# Patient Record
Sex: Female | Born: 1965 | Race: Black or African American | Hispanic: No | Marital: Married | Smoking: Never smoker
Health system: Southern US, Community
[De-identification: ages and names within clinical notes are randomized; demographics above are authoritative.]

## PROBLEM LIST (undated history)

## (undated) DIAGNOSIS — I509 Heart failure, unspecified: Secondary | ICD-10-CM

## (undated) DIAGNOSIS — I517 Cardiomegaly: Secondary | ICD-10-CM

## (undated) DIAGNOSIS — E119 Type 2 diabetes mellitus without complications: Secondary | ICD-10-CM

## (undated) HISTORY — PX: CARDIAC DEFIBRILLATOR PLACEMENT: SHX171

---

## 2016-02-02 ENCOUNTER — Encounter (HOSPITAL_BASED_OUTPATIENT_CLINIC_OR_DEPARTMENT_OTHER): Payer: Self-pay

## 2016-02-02 ENCOUNTER — Emergency Department (HOSPITAL_BASED_OUTPATIENT_CLINIC_OR_DEPARTMENT_OTHER)
Admission: EM | Admit: 2016-02-02 | Discharge: 2016-02-02 | Disposition: A | Discharge status: Home / Self Care | Payer: Federal, State, Local not specified - PPO | Attending: Emergency Medicine | Admitting: Emergency Medicine

## 2016-02-02 ENCOUNTER — Emergency Department (HOSPITAL_BASED_OUTPATIENT_CLINIC_OR_DEPARTMENT_OTHER): Payer: Federal, State, Local not specified - PPO

## 2016-02-02 DIAGNOSIS — I509 Heart failure, unspecified: Secondary | ICD-10-CM | POA: Insufficient documentation

## 2016-02-02 DIAGNOSIS — W010XXA Fall on same level from slipping, tripping and stumbling without subsequent striking against object, initial encounter: Secondary | ICD-10-CM | POA: Diagnosis not present

## 2016-02-02 DIAGNOSIS — M25571 Pain in right ankle and joints of right foot: Secondary | ICD-10-CM | POA: Diagnosis present

## 2016-02-02 DIAGNOSIS — S80212A Abrasion, left knee, initial encounter: Secondary | ICD-10-CM | POA: Diagnosis not present

## 2016-02-02 DIAGNOSIS — Z79899 Other long term (current) drug therapy: Secondary | ICD-10-CM | POA: Diagnosis not present

## 2016-02-02 DIAGNOSIS — Y999 Unspecified external cause status: Secondary | ICD-10-CM | POA: Insufficient documentation

## 2016-02-02 DIAGNOSIS — Z794 Long term (current) use of insulin: Secondary | ICD-10-CM | POA: Diagnosis not present

## 2016-02-02 DIAGNOSIS — E119 Type 2 diabetes mellitus without complications: Secondary | ICD-10-CM | POA: Insufficient documentation

## 2016-02-02 DIAGNOSIS — Y9302 Activity, running: Secondary | ICD-10-CM | POA: Insufficient documentation

## 2016-02-02 DIAGNOSIS — Y92002 Bathroom of unspecified non-institutional (private) residence single-family (private) house as the place of occurrence of the external cause: Secondary | ICD-10-CM | POA: Insufficient documentation

## 2016-02-02 DIAGNOSIS — S93401A Sprain of unspecified ligament of right ankle, initial encounter: Secondary | ICD-10-CM | POA: Insufficient documentation

## 2016-02-02 HISTORY — DX: Heart failure, unspecified: I50.9

## 2016-02-02 HISTORY — DX: Type 2 diabetes mellitus without complications: E11.9

## 2016-02-02 HISTORY — DX: Cardiomegaly: I51.7

## 2016-02-02 NOTE — ED Notes (Signed)
Slipped on mat at home this am-twister right foot and fell on knees-ambulated to D-placed in w/c at triage

## 2016-02-02 NOTE — ED Provider Notes (Signed)
CSN: [id number]     Arrival date & time 02/02/16  1144 History   First MD Initiated Contact with Patient 02/02/16 1152     Chief Complaint  Patient presents with  . Fall     (Consider location/radiation/quality/duration/timing/severity/associated sxs/prior Treatment) HPI Comments: 50 year old female with history of CHF, cardiomegaly, diabetes mellitus presents for right ankle pain. The patient states that she was getting ready for work and had to run back into the bathroom for something. She says she was in high heels in one of her heels slipped on the mat in the bathroom and she fell down onto her knees and seemed to twist her right ankle when doing so. She denies any head injury. No loss of consciousness. She reports other than her ankle she feels well.  Patient is a 50 y.o. female presenting with fall.  Fall Pertinent negatives include no chest pain, no abdominal pain, no headaches and no shortness of breath.    Past Medical History  Diagnosis Date  . Cardiomegaly   . CHF (congestive heart failure) (HCC)   . Diabetes mellitus without complication [facility](HCC)    Past Surgical History  Procedure Laterality Date  . Cardiac defibrillator placement     No family history on file. Social History  Substance Use Topics  . Smoking status: Never Smoker   . Smokeless tobacco: None  . Alcohol Use: No   OB History    No data available     Review of Systems  Constitutional: Negative for fever, chills and fatigue.  HENT: Negative for congestion, postnasal drip, rhinorrhea and sinus pressure.   Eyes: Negative for visual disturbance.  Respiratory: Negative for chest tightness and shortness of breath.   Cardiovascular: Negative for chest pain, palpitations and leg swelling.  Gastrointestinal: Negative for nausea, vomiting and abdominal pain.  Genitourinary: Negative for flank pain.  Musculoskeletal: Positive for arthralgias (right ankle). Negative for myalgias and back pain.  Skin: Positive  for wound (Abrasion to left knee).  Neurological: Negative for dizziness, weakness, light-headedness and headaches.  Hematological: Does not bruise/bleed easily.      Allergies  Compazine  Home Medications   Prior to Admission medications   Medication Sig Start Date End Date Taking? Authorizing Provider  carvedilol (COREG) 25 MG tablet Take 25 mg by mouth 2 (two) times daily with a meal.   Yes Historical Provider, MD  Digoxin (DIGOX PO) Take by mouth.   Yes Historical Provider, MD  enalapril (VASOTEC) 20 MG tablet Take 20 mg by mouth daily.   Yes Historical Provider, MD  furosemide (LASIX) 80 MG tablet Take 80 mg by mouth.   Yes Historical Provider, MD  insulin detemir (LEVEMIR) 100 UNIT/ML injection Inject into the skin at bedtime.   Yes Historical Provider, MD  spironolactone (ALDACTONE) 25 MG tablet Take 25 mg by mouth daily.   Yes Historical Provider, MD   BP 106/82 mmHg  Pulse 112  Temp(Src) 98.3 F (36.8 C) (Oral)  Resp 16  Ht 5\' 5"  (1.651 m)  Wt 140 lb (63.504 kg)  BMI 23.30 kg/m2  SpO2 98%  LMP 01/03/2016 Physical Exam  Constitutional: She is oriented to person, place, and time. She appears well-developed and well-nourished. No distress.  HENT:  Head: Normocephalic and atraumatic.  Right Ear: External ear normal.  Left Ear: External ear normal.  Nose: Nose normal.  Mouth/Throat: Oropharynx is clear and moist. No oropharyngeal exudate.  Eyes: EOM are normal. Pupils are equal, round, and reactive to light.  Neck: Normal  range of motion. Neck supple.  Cardiovascular: Normal rate, regular rhythm, normal heart sounds and intact distal pulses.   No murmur heard. Pulmonary/Chest: Effort normal. No respiratory distress. She has no wheezes. She has no rales. She exhibits no tenderness.  Abdominal: Soft. She exhibits no distension. There is no tenderness.  Musculoskeletal: She exhibits no edema.       Right hip: Normal.       Left hip: Normal.       Right knee: Normal.  No tenderness found.       Left knee: Normal.       Right ankle: She exhibits decreased range of motion (increased pain with inversion of the ankle) and swelling. She exhibits no deformity, no laceration and normal pulse. Tenderness. Achilles tendon normal.       Left ankle: Normal.       Right lower leg: Normal.       Feet:  No tenderness over the proximal fibula. Small abrasion over her left knee Without any bony tenderness or swelling  Neurological: She is alert and oriented to person, place, and time.  Skin: Skin is warm and dry. No rash noted. She is not diaphoretic.  Vitals reviewed.   ED Course  Procedures (including critical care time) Labs Review Labs Reviewed - No data to display  Imaging Review Dg Ankle Complete Right  02/02/2016  CLINICAL DATA:  Right lateral ankle pain after twisting injury, fall. EXAM: RIGHT ANKLE - COMPLETE 3+ VIEW COMPARISON:  None. FINDINGS: There is no evidence of fracture, dislocation, or joint effusion. There is no evidence of arthropathy or other focal bone abnormality. Soft tissues are unremarkable. IMPRESSION: Negative. Electronically Signed   By: Charlett NoseKevin  Dover M.D.   On: 02/02/2016 12:45   I have personally reviewed and evaluated these images and lab results as part of my medical decision-making.   EKG Interpretation None      MDM  Patient seen and evaluated in stable condition. X-ray negative for acute process. Physical examination consistent with ankle sprain. Patient was placed in an ASO and provided crutches. She was educated on ice and elevation. She was instructed to follow-up outpatient. She and her husband expressed understanding and agreement with plan of care. Final diagnoses:  Ankle sprain, right, initial encounter    1. Right ankle sprain    Leta BaptistEmily Roe Allsion Nogales, MD 02/02/16 1319

## 2016-02-02 NOTE — Discharge Instructions (Signed)
You were seen and evaluated today for your ankle injury. It appears that you have a bad sprain. Please use the supportive brace provided. Do not bear weight on your ankle for the next few days and slowly begin to bear weight on it and return to normal activity as tolerated. Follow-up outpatient for reevaluation with either your own primary care physician or with the physician whose information is provided who specializes in sports medicine. Try to ice her ankle for 20 minutes every hour and keep it elevated whenever you are resting.  Ankle Sprain An ankle sprain is an injury to the strong, fibrous tissues (ligaments) that hold the bones of your ankle joint together.  CAUSES An ankle sprain is usually caused by a fall or by twisting your ankle. Ankle sprains most commonly occur when you step on the outer edge of your foot, and your ankle turns inward. People who participate in sports are more prone to these types of injuries.  SYMPTOMS   Pain in your ankle. The pain may be present at rest or only when you are trying to stand or walk.  Swelling.  Bruising. Bruising may develop immediately or within 1 to 2 days after your injury.  Difficulty standing or walking, particularly when turning corners or changing directions. DIAGNOSIS  Your caregiver will ask you details about your injury and perform a physical exam of your ankle to determine if you have an ankle sprain. During the physical exam, your caregiver will press on and apply pressure to specific areas of your foot and ankle. Your caregiver will try to move your ankle in certain ways. An X-ray exam may be done to be sure a bone was not broken or a ligament did not separate from one of the bones in your ankle (avulsion fracture).  TREATMENT  Certain types of braces can help stabilize your ankle. Your caregiver can make a recommendation for this. Your caregiver may recommend the use of medicine for pain. If your sprain is severe, your caregiver may  refer you to a surgeon who helps to restore function to parts of your skeletal system (orthopedist) or a physical therapist. HOME CARE INSTRUCTIONS   Apply ice to your injury for 1-2 days or as directed by your caregiver. Applying ice helps to reduce inflammation and pain.  Put ice in a plastic bag.  Place a towel between your skin and the bag.  Leave the ice on for 15-20 minutes at a time, every 2 hours while you are awake.  Only take over-the-counter or prescription medicines for pain, discomfort, or fever as directed by your caregiver.  Elevate your injured ankle above the level of your heart as much as possible for 2-3 days.  If your caregiver recommends crutches, use them as instructed. Gradually put weight on the affected ankle. Continue to use crutches or a cane until you can walk without feeling pain in your ankle.  If you have a plaster splint, wear the splint as directed by your caregiver. Do not rest it on anything harder than a pillow for the first 24 hours. Do not put weight on it. Do not get it wet. You may take it off to take a shower or bath.  You may have been given an elastic bandage to wear around your ankle to provide support. If the elastic bandage is too tight (you have numbness or tingling in your foot or your foot becomes cold and blue), adjust the bandage to make it comfortable.  If you  have an air splint, you may blow more air into it or let air out to make it more comfortable. You may take your splint off at night and before taking a shower or bath. Wiggle your toes in the splint several times per day to decrease swelling. SEEK MEDICAL CARE IF:   You have rapidly increasing bruising or swelling.  Your toes feel extremely cold or you lose feeling in your foot.  Your pain is not relieved with medicine. SEEK IMMEDIATE MEDICAL CARE IF:  Your toes are numb or blue.  You have severe pain that is increasing. MAKE SURE YOU:   Understand these  instructions.  Will watch your condition.  Will get help right away if you are not doing well or get worse.   This information is not intended to replace advice given to you by your health care provider. Make sure you discuss any questions you have with your health care provider.   Document Released: 07/24/2005 Document Revised: 08/14/2014 Document Reviewed: 08/05/2011 Elsevier Interactive Patient Education [employer]2016 Elsevier Inc.

## 2016-02-02 NOTE — ED Notes (Signed)
Pt and husband given d/c instructions as per chart. Verbalizes understanding. No questions. 

## 2020-12-08 ENCOUNTER — Emergency Department (HOSPITAL_COMMUNITY): Payer: Federal, State, Local not specified - PPO

## 2020-12-08 ENCOUNTER — Emergency Department (HOSPITAL_COMMUNITY)
Admission: EM | Admit: 2020-12-08 | Discharge: 2021-01-05 | Disposition: E | Payer: Federal, State, Local not specified - PPO | Attending: Emergency Medicine | Admitting: Emergency Medicine

## 2020-12-08 ENCOUNTER — Emergency Department (HOSPITAL_BASED_OUTPATIENT_CLINIC_OR_DEPARTMENT_OTHER): Payer: Federal, State, Local not specified - PPO

## 2020-12-08 DIAGNOSIS — J9601 Acute respiratory failure with hypoxia: Secondary | ICD-10-CM | POA: Insufficient documentation

## 2020-12-08 DIAGNOSIS — T8621 Heart transplant rejection: Secondary | ICD-10-CM

## 2020-12-08 DIAGNOSIS — I509 Heart failure, unspecified: Secondary | ICD-10-CM | POA: Insufficient documentation

## 2020-12-08 DIAGNOSIS — E119 Type 2 diabetes mellitus without complications: Secondary | ICD-10-CM | POA: Insufficient documentation

## 2020-12-08 DIAGNOSIS — I469 Cardiac arrest, cause unspecified: Secondary | ICD-10-CM | POA: Diagnosis not present

## 2020-12-08 DIAGNOSIS — Z941 Heart transplant status: Secondary | ICD-10-CM | POA: Diagnosis not present

## 2020-12-08 DIAGNOSIS — Z794 Long term (current) use of insulin: Secondary | ICD-10-CM | POA: Diagnosis not present

## 2020-12-08 LAB — CBC WITH DIFFERENTIAL/PLATELET
Abs Immature Granulocytes: 2.21 10*3/uL — ABNORMAL HIGH (ref 0.00–0.07)
Basophils Absolute: 0.1 10*3/uL (ref 0.0–0.1)
Basophils Relative: 1 %
Eosinophils Absolute: 0 10*3/uL (ref 0.0–0.5)
Eosinophils Relative: 0 %
HCT: 35.2 % — ABNORMAL LOW (ref 36.0–46.0)
Hemoglobin: 9.5 g/dL — ABNORMAL LOW (ref 12.0–15.0)
Immature Granulocytes: 18 %
Lymphocytes Relative: 13 %
Lymphs Abs: 1.6 10*3/uL (ref 0.7–4.0)
MCH: 30.1 pg (ref 26.0–34.0)
MCHC: 27 g/dL — ABNORMAL LOW (ref 30.0–36.0)
MCV: 111.4 fL — ABNORMAL HIGH (ref 80.0–100.0)
Monocytes Absolute: 0.5 10*3/uL (ref 0.1–1.0)
Monocytes Relative: 4 %
Neutro Abs: 8 10*3/uL — ABNORMAL HIGH (ref 1.7–7.7)
Neutrophils Relative %: 64 %
Platelets: 152 10*3/uL (ref 150–400)
RBC: 3.16 MIL/uL — ABNORMAL LOW (ref 3.87–5.11)
RDW: 16.5 % — ABNORMAL HIGH (ref 11.5–15.5)
WBC: 12.4 10*3/uL — ABNORMAL HIGH (ref 4.0–10.5)
nRBC: 0.6 % — ABNORMAL HIGH (ref 0.0–0.2)

## 2020-12-08 LAB — I-STAT ARTERIAL BLOOD GAS, ED
Acid-base deficit: 23 mmol/L — ABNORMAL HIGH (ref 0.0–2.0)
Acid-base deficit: 21 mmol/L — ABNORMAL HIGH (ref 0.0–2.0)
Bicarbonate: 9.3 mmol/L — ABNORMAL LOW (ref 20.0–28.0)
Bicarbonate: 9.9 mmol/L — ABNORMAL LOW (ref 20.0–28.0)
Calcium, Ion: 1.11 mmol/L — ABNORMAL LOW (ref 1.15–1.40)
Calcium, Ion: 1.03 mmol/L — ABNORMAL LOW (ref 1.15–1.40)
HCT: 41 % (ref 36.0–46.0)
HCT: 41 % (ref 36.0–46.0)
Hemoglobin: 13.9 g/dL (ref 12.0–15.0)
Hemoglobin: 13.9 g/dL (ref 12.0–15.0)
O2 Saturation: 83 %
O2 Saturation: 88 %
Patient temperature: 96.2
Patient temperature: 96.2
Potassium: 4.6 mmol/L (ref 3.5–5.1)
Potassium: 4.7 mmol/L (ref 3.5–5.1)
Sodium: 132 mmol/L — ABNORMAL LOW (ref 135–145)
Sodium: 134 mmol/L — ABNORMAL LOW (ref 135–145)
TCO2: 11 mmol/L — ABNORMAL LOW (ref 22–32)
TCO2: 11 mmol/L — ABNORMAL LOW (ref 22–32)
pCO2 arterial: 43.6 mmHg (ref 32.0–48.0)
pCO2 arterial: 38 mmHg (ref 32.0–48.0)
pH, Arterial: 6.927 — CL (ref 7.350–7.450)
pH, Arterial: 7.015 — CL (ref 7.350–7.450)
pO2, Arterial: 72 mmHg — ABNORMAL LOW (ref 83.0–108.0)
pO2, Arterial: 76 mmHg — ABNORMAL LOW (ref 83.0–108.0)

## 2020-12-08 LAB — COMPREHENSIVE METABOLIC PANEL
ALT: 151 U/L — ABNORMAL HIGH (ref 0–44)
AST: 135 U/L — ABNORMAL HIGH (ref 15–41)
Albumin: 2.3 g/dL — ABNORMAL LOW (ref 3.5–5.0)
Alkaline Phosphatase: 51 U/L (ref 38–126)
Anion gap: 20 — ABNORMAL HIGH (ref 5–15)
BUN: 29 mg/dL — ABNORMAL HIGH (ref 6–20)
CO2: 8 mmol/L — ABNORMAL LOW (ref 22–32)
Calcium: 6.3 mg/dL — CL (ref 8.9–10.3)
Chloride: 108 mmol/L (ref 98–111)
Creatinine, Ser: 1.69 mg/dL — ABNORMAL HIGH (ref 0.44–1.00)
GFR, Estimated: 36 mL/min — ABNORMAL LOW (ref >60)
Glucose, Bld: 652 mg/dL (ref 70–99)
Potassium: 3.1 mmol/L — ABNORMAL LOW (ref 3.5–5.1)
Sodium: 136 mmol/L (ref 135–145)
Total Bilirubin: 0.4 mg/dL (ref 0.3–1.2)
Total Protein: 3.6 g/dL — ABNORMAL LOW (ref 6.5–8.1)

## 2020-12-08 LAB — ECHOCARDIOGRAM COMPLETE
Area-P 1/2: 3.72 cm2
Height: 65 in
Single Plane A4C EF: 39.9 %

## 2020-12-08 LAB — I-STAT VENOUS BLOOD GAS, ED
Acid-base deficit: 20 mmol/L — ABNORMAL HIGH (ref 0.0–2.0)
Bicarbonate: 10.3 mmol/L — ABNORMAL LOW (ref 20.0–28.0)
Calcium, Ion: 0.92 mmol/L — ABNORMAL LOW (ref 1.15–1.40)
HCT: 36 % (ref 36.0–46.0)
Hemoglobin: 12.2 g/dL (ref 12.0–15.0)
O2 Saturation: 65 %
Potassium: 4.3 mmol/L (ref 3.5–5.1)
Sodium: 137 mmol/L (ref 135–145)
TCO2: 11 mmol/L — ABNORMAL LOW (ref 22–32)
pCO2, Ven: 38.4 mmHg — ABNORMAL LOW (ref 44.0–60.0)
pH, Ven: 7.036 — CL (ref 7.250–7.430)
pO2, Ven: 48 mmHg — ABNORMAL HIGH (ref 32.0–45.0)

## 2020-12-08 LAB — I-STAT CHEM 8, ED
BUN: 35 mg/dL — ABNORMAL HIGH (ref 6–20)
Calcium, Ion: 0.93 mmol/L — ABNORMAL LOW (ref 1.15–1.40)
Chloride: 109 mmol/L (ref 98–111)
Creatinine, Ser: 1.2 mg/dL — ABNORMAL HIGH (ref 0.44–1.00)
Glucose, Bld: 373 mg/dL — ABNORMAL HIGH (ref 70–99)
HCT: 35 % — ABNORMAL LOW (ref 36.0–46.0)
Hemoglobin: 11.9 g/dL — ABNORMAL LOW (ref 12.0–15.0)
Potassium: 4.1 mmol/L (ref 3.5–5.1)
Sodium: 137 mmol/L (ref 135–145)
TCO2: 13 mmol/L — ABNORMAL LOW (ref 22–32)

## 2020-12-08 LAB — LACTIC ACID, PLASMA: Lactic Acid, Venous: >11 mmol/L (ref 0.5–1.9)

## 2020-12-08 LAB — I-STAT BETA HCG BLOOD, ED (MC, WL, AP ONLY): I-stat hCG, quantitative: 9.1 m[IU]/mL — ABNORMAL HIGH

## 2020-12-08 LAB — TROPONIN I (HIGH SENSITIVITY): Troponin I (High Sensitivity): 2910 ng/L (ref <18)

## 2020-12-08 LAB — PROTIME-INR
INR: 1.7 — ABNORMAL HIGH (ref 0.8–1.2)
Prothrombin Time: 19.8 seconds — ABNORMAL HIGH (ref 11.4–15.2)

## 2020-12-08 MED ORDER — HEPARIN (PORCINE) 25000 UT/250ML-% IV SOLN: 15.0000 [IU]/kg/h | INTRAVENOUS | Status: DC

## 2020-12-08 MED ORDER — FENTANYL CITRATE (PF) 100 MCG/2ML IJ SOLN: 100.0000 ug | INTRAMUSCULAR | Status: DC | PRN

## 2020-12-08 MED ORDER — ETOMIDATE 2 MG/ML IV SOLN
INTRAVENOUS | Status: AC | PRN
  Administered 2020-12-08: 20 mg via INTRAVENOUS

## 2020-12-08 MED ORDER — PANTOPRAZOLE SODIUM 40 MG IV SOLR: 40.0000 mg | INTRAVENOUS | Status: DC

## 2020-12-08 MED ORDER — SODIUM CHLORIDE 0.9 % IV SOLN
1000.0000 mg | Freq: Once | INTRAVENOUS | Status: DC
  Filled 2020-12-08: qty 100

## 2020-12-08 MED ORDER — SODIUM CHLORIDE 0.9 % IV BOLUS
1000.0000 mL | Freq: Once | INTRAVENOUS | Status: AC
  Administered 2020-12-08: 1000 mL via INTRAVENOUS

## 2020-12-08 MED ORDER — FENTANYL CITRATE (PF) 100 MCG/2ML IJ SOLN
100.0000 ug | INTRAMUSCULAR | Status: DC | PRN
Start: 2020-12-08 — End: 2020-12-09

## 2020-12-08 MED ORDER — HEPARIN (PORCINE) 25000 UT/250ML-% IV SOLN
900.0000 [IU]/h | INTRAVENOUS | Status: DC
  Administered 2020-12-08: 900 [IU]/h via INTRAVENOUS
  Filled 2020-12-08: qty 250

## 2020-12-08 MED ORDER — IOHEXOL 350 MG/ML SOLN
60.0000 mL | Freq: Once | INTRAVENOUS | Status: AC | PRN
  Administered 2020-12-08: 60 mL via INTRAVENOUS

## 2020-12-08 MED ORDER — EPINEPHRINE 0.1 MG/10ML (10 MCG/ML) SYRINGE FOR IV PUSH (FOR BLOOD PRESSURE SUPPORT)
PREFILLED_SYRINGE | INTRAVENOUS | Status: AC | PRN
  Administered 2020-12-08: 5 ug via INTRAVENOUS

## 2020-12-08 MED ORDER — SODIUM BICARBONATE 8.4 % IV SOLN
INTRAVENOUS | Status: AC | PRN
  Administered 2020-12-08: 50 meq via INTRAVENOUS

## 2020-12-08 MED ORDER — NOREPINEPHRINE 4 MG/250ML-% IV SOLN
0.0000 ug/min | INTRAVENOUS | Status: DC
  Administered 2020-12-08: 1.3333 ug/min via INTRAVENOUS
  Filled 2020-12-08: qty 250

## 2020-12-08 MED ORDER — INSULIN ASPART 100 UNIT/ML IJ SOLN: 0.0000 [IU] | INTRAMUSCULAR | Status: DC

## 2020-12-08 MED ORDER — ROCURONIUM BROMIDE 50 MG/5ML IV SOLN
INTRAVENOUS | Status: AC | PRN
  Administered 2020-12-08: 70 mg via INTRAVENOUS

## 2020-12-08 MED ORDER — VASOPRESSIN 20 UNITS/100 ML INFUSION FOR SHOCK
0.0000 [IU]/min | INTRAVENOUS | Status: DC
  Filled 2020-12-08: qty 100

## 2020-12-08 MED ORDER — HEPARIN BOLUS VIA INFUSION
4600.0000 [IU] | Freq: Once | INTRAVENOUS | Status: DC
  Filled 2020-12-08: qty 4600

## 2020-12-08 MED ORDER — STERILE WATER FOR INJECTION IV SOLN
INTRAVENOUS | Status: DC
  Filled 2020-12-08: qty 1000

## 2020-12-08 MED ORDER — EPINEPHRINE HCL 5 MG/250ML IV SOLN IN NS
0.5000 ug/min | INTRAVENOUS | Status: DC
  Administered 2020-12-08: 2 ug/min via INTRAVENOUS
  Filled 2020-12-08: qty 250

## 2020-12-08 MED ORDER — HEPARIN BOLUS VIA INFUSION
4000.0000 [IU] | Freq: Once | INTRAVENOUS | Status: AC
  Administered 2020-12-08: 4000 [IU] via INTRAVENOUS
  Filled 2020-12-08: qty 4000

## 2020-12-08 MED ORDER — SODIUM CHLORIDE 0.9 % IV SOLN
1000.0000 mg | Freq: Once | INTRAVENOUS | Status: DC
  Filled 2020-12-08: qty 8

## 2020-12-31 ENCOUNTER — Telehealth: Payer: Self-pay | Admitting: Pulmonary Disease

## 2020-12-31 NOTE — Telephone Encounter (Signed)
Funeral home contacted [place] for signature of electronic death certificate in [place] for Dr. Denese Killings. Dr. Denese Killings off, Dr. Judeth Horn has signed and completed death certificate all parties aware. -pr

## 2021-01-05 NOTE — Procedures (Signed)
Arterial Catheter Insertion Procedure Note Paige Santana [id number] 1966/05/04  Procedure: Insertion of Arterial Catheter  Indications: Blood pressure monitoring and Frequent blood sampling  Procedure Details Consent: Unable to obtain consent because of emergent medical necessity. Time Out: Verified patient identification, verified procedure, site/side was marked, verified correct patient position, special equipment/implants available, medications/allergies/relevent history reviewed, required imaging and test results available.  Performed  Maximum sterile technique was used including antiseptics, cap, gloves, gown, hand hygiene, mask and sheet. Skin prep: Chlorhexidine; local anesthetic administered 20 gauge catheter was inserted into right femoral artery using the Seldinger technique.  Evaluation Blood flow good; BP tracing poor. Complications: No apparent complications.   Paige Santana Paige Santana ACNP Paige Santana PCCM Pager [phone] till 3 pm If no answer page [phone] 01/04/2021, 12:36 PM

## 2021-01-05 NOTE — Consult Note (Signed)
Cardiology Consultation:   Patient ID: Paige Santana MRN: [id number]; DOB: 13-Dec-1965  Admit date: 12-16-20 Date of Consult: 12/16/20  PCP:  No primary care provider on file.   [facility] Providers Cardiologist:  Malachi Pro and Lyndle Herrlich [facility] [address]  }     Patient Profile:   Paige Santana is a 55 y.o. 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  History of Present Illness:   Paige Santana the patient suffered witnessed out of hospital cardiac arrest with seizure-like activity.  Unclear if any CPR performed prior to arrival of EMS.  Upon EMS arrival, patient did not have a shockable rhythm/flatline.  CPR was begun and eventually after multiple rounds of epinephrine and resuscitation, ROSC was achieved.  Resuscitative efforts lasted at least 40 minutes.  EKG in [facility], ER reveals inferior ST elevation with reciprocal precordial changes consistent with STEMI.  Initial pH 7.0, PO2 79 and blood pressure less than 70 mmHg.  IV norepinephrine and epinephrine were started.  IV fluid administration was given with at least 1 L given within the first hour of arrival.  Follow-up pH 6.9 despite being on IV bicarbonate.  PCO2 is 48.  Spoke with heart transplant team at [facility], Dr. Bufford Buttner and subsequently Dr. Rae Lips.  They revealed recent difficulty with rejection, recent cardiac catheterization demonstrating widely patent coronaries including RCA, they recommended against coronary angiography feeling that this was likely a post arrest EKG phenomenon or coronary spasm but unlikely to be a ruptured plaque with thrombosis.  They would like to have the patient transported to [facility] if we can improve the current clinical state including blood pressure,  acidosis, and oxygenation.  Baseline creatinine is 1.15, hemoglobin 11.7, coronary angiogram 10/2020 revealed widely patent coronary arteries, and EF by echocardiography 65% December 02, 2020.  LifeVest was discontinued.   Past Medical History:  Diagnosis Date  . Cardiomegaly   . CHF (congestive heart failure) (HCC)   . Diabetes mellitus without complication [facility])     Past Surgical History:  Procedure Laterality Date  . CARDIAC DEFIBRILLATOR PLACEMENT       Home Medications:  Prior to Admission medications   Medication Sig Start Date End Date Taking? Authorizing Provider  carvedilol (COREG) 25 MG tablet Take 25 mg by mouth 2 (two) times daily with a meal.    [provider]  dapsone 100 MG tablet Take 100 mg by mouth daily. 11/17/20   [provider]  Digoxin (DIGOX PO) Take by mouth.    [provider]  enalapril (VASOTEC) 20 MG tablet Take 20 mg by mouth daily.    [provider]  FARXIGA 10 MG TABS tablet Take 10 mg by mouth daily. 11/17/20   [provider]  furosemide (LASIX) 80 MG tablet Take 80 mg by mouth.    [provider]  insulin detemir (LEVEMIR) 100 UNIT/ML injection Inject into the skin at bedtime.    [provider]  lansoprazole (PREVACID) 30 MG capsule Take 30 mg by mouth daily. 12/03/20   [provider]  spironolactone (ALDACTONE) 25 MG tablet Take 25 mg by mouth daily.    [provider]    Inpatient Medications: Scheduled Meds:  Continuous Infusions: . epinephrine 2 mcg/min (16-Dec-2020 1103)  . norepinephrine (LEVOPHED)  Adult infusion 1.3333 mcg/min (12/09/2020 1036)  .  sodium bicarbonate (isotonic) infusion in sterile water 125 mL/hr at 2020-12-09 1122   PRN Meds: fentaNYL (SUBLIMAZE) injection, fentaNYL (SUBLIMAZE) injection  Allergies:    Allergies  Allergen Reactions  . Compazine [Prochlorperazine Edisylate]     Social History:   Social History   Socioeconomic History  .  Marital status: Married    Spouse name: Not on file  . Number of children: Not on file  . Years of education: Not on file  . Highest education level: Not on file  Occupational History  . Not on file  Tobacco Use  . Smoking status: Never Smoker  . Smokeless tobacco: Not on file  Substance and Sexual Activity  . Alcohol use: No  . Drug use: No  . Sexual activity: Yes    Birth control/protection: I.U.D.  Other Topics Concern  . Not on file  Social History Narrative  . Not on file   Social Determinants of Health   Financial Resource Strain: Not on file  Food Insecurity: Not on file  Transportation Needs: Not on file  Physical Activity: Not on file  Stress: Not on file  Social Connections: Not on file  Intimate Partner Violence: Not on file    Family History:   No family history on file.   ROS:  Please see the history of present illness.  No history.  As noted severe rejection.  Conversation with Dr. Bufford Buttner and Dr. Rae Lips at [facility].  Intimated that if we could stabilize the patient there will be happy to take her at [facility].  They did not recommend coronary angiography with recent normal arteries in March 2022. All other ROS reviewed and negative.     Physical Exam/Data:   Vitals:   2020-12-09 1103 Dec 09, 2020 1106 Dec 09, 2020 1109 12-09-20 1125  BP: (!) 101/58 98/74 105/71   Pulse:  75 75   Resp: 20 (!) 22 (!) 22   Temp:    (!) 96.2 F (35.7 C)  TempSrc:    Temporal  SpO2: 92% 90% 90%   Height: 5\' 5"  (1.651 m)       Intake/Output Summary (Last 24 hours) at Dec 09, 2020 1140 Last data filed at 12/09/20 1110 Gross per 24 hour  Intake 1000 ml  Output --  Net 1000 ml   Last 3 Weights 02/02/2016  Weight (lbs) 140 lb  Weight (kg) 63.504 kg     Body mass index is 23.3 kg/m.  General: Mildly overweight, well developed, intubated, comatose, on no CNS altering medications without movement on the hospital gurney HEENT: normal Lymph: no adenopathy Neck: no JVD Endocrine:  No  thryomegaly Vascular: No carotid bruits; FA pulses 2+ bilaterally without bruits  Cardiac:  normal S1, S2; RRR; no murmur.  Heart sounds are distant. Lungs:  clear to auscultation bilaterally, no wheezing, rhonchi or rales  Abd: soft, nontender, no hepatomegaly  Ext: no edema Musculoskeletal:  No deformities, BUE and BLE strength normal and equal Skin: warm and dry  Neuro: No activity.  Flaccid. Psych:  Normal affect   EKG:  The EKG was personally reviewed and demonstrates: ST elevation II, III, and aVF with reciprocal ST segment depression V1 through 3. Telemetry:  Telemetry was personally reviewed and demonstrates: Sinus rhythm, with marked ST abnormality noted.  Relevant CV Studies: None to this point. Recent coronary angiogram demonstrated widely patent coronary arteries.  Echocardiogram in April revealed EF 65%.  Laboratory Data:  High Sensitivity Troponin:  No results for input(s):  TROPONINIHS in the last 720 hours.   Chemistry Recent Labs  Lab 12/27/20 1046 12-27-2020 1047 12-27-20 1129  NA 137 137 132*  K 4.1 4.3 4.6  CL 109  --   --   GLUCOSE 373*  --   --   BUN 35*  --   --   CREATININE 1.20*  --   --     No results for input(s): PROT, ALBUMIN, AST, ALT, ALKPHOS, BILITOT in the last 168 hours. Hematology Recent Labs  Lab Dec 27, 2020 1047 12/27/20 1122 27-Dec-2020 1129  WBC  --  12.4*  --   RBC  --  3.16*  --   HGB 12.2 9.5* 13.9  HCT 36.0 35.2* 41.0  MCV  --  111.4*  --   MCH  --  30.1  --   MCHC  --  27.0*  --   RDW  --  16.5*  --   PLT  --  152  --    BNPNo results for input(s): BNP, PROBNP in the last 168 hours.  DDimer No results for input(s): DDIMER in the last 168 hours.   Radiology/Studies:  DG Chest Portable 1 View  Result Date: 12-27-20 CLINICAL DATA:  Post CPR.  History heart transplant EXAM: PORTABLE CHEST 1 VIEW COMPARISON:  None available FINDINGS: Overlying cardiac leads and pacer pads. Endotracheal tube terminates approximately 2.7 cm above  the carina. Enteric tube courses below the diaphragm with distal tip extending beyond the inferior margin of the film. Right upper extremity PICC line terminates at the level of the superior cavoatrial junction. Prior median sternotomy. Heart size is upper limits of normal, which may be accentuated by AP portable supine technique. Diffuse hazy opacities throughout both lungs. No large pleural fluid collection. No pneumothorax. No acute osseous findings. IMPRESSION: 1. Lines and tubes as described. 2. Diffuse hazy opacities throughout both lungs, may reflect pulmonary edema, ARDS, versus multifocal infection. 3. Heart size upper limits of normal, which may be accentuated by AP portable supine technique. Electronically Signed   By: Duanne Guess D.O.   On: 2020-12-27 11:29     Assessment and Plan:   1. Out of hospital cardiac arrest without bystander CPR and subsequent 40 minutes of CPR before ROSC by EMS. 2. Inferior wall injury/STEMI currently noted on postarrest/ROSC EKG.  Likely having some RV involvement.  We will give significant volume load to help support blood pressure.  Transplant team advised against emergency catheterization and felt EKG changes could be related to cardiac arrest or coronary vasospasm. 3. Severe metabolic acidosis with pH of 7 despite ventilation and IV bicarbonate. 4. Cardiogenic shock requiring high-dose IV norepinephrine and epinephrine.  Much of this is related to metabolic derangements/acidosis but patient also having likely an RV ischemic injury related to inferior infarction. 5. Severe cardiac rejection over the last 3 to 4 months but with documentation of normal LV function as recently as late April 2022.  Overall outlook is somewhat grim given the absence of early CPR CPR and duration of resuscitation prior to ROSC.  If can stabilize, I will be back in touch with the heart transplant team at [facility] concerning transport to their  facility.   Risk Assessment/Risk Scores:        For questions or updates, please contact [facility] Please consult [email or web address] for contact info under     CRITICAL CARE TIME: 45 minutes  Signed, Lesleigh Noe, MD  12-27-20 11:40 AM

## 2021-01-05 NOTE — Progress Notes (Signed)
Visited with patient family. Accompanied  ED Doctor to talk with family. Patient is still very sick and  Doctor. Still trying to determine cause of sickness.  Provided information  sharing, emotional and spiritual support to family. Patient currently intubated.  Will follow as needed.  Venida Jarvis, [place], [facility], Pager [phone]

## 2021-01-05 NOTE — ED Triage Notes (Signed)
CPR in progress reported by EMS. On [facility] arrival to ED, EMS reports ROSC has been achieved and has sustained for approx 10 minutes. EMS reports that pt was home with caretaker which reported to them that pt became unresponsive and had 2 seizures.EMS reports further that FD started and did 4 rounds of CPR with no shock advised on AED and that on EMS arrival approx 0947 CPR was continued with pt in asystole. EMS admin- epi x6, 1 L NS, IO, and king airway in place. EMS reports multiple events of pt coding and achieving ROSC while in their care but sustained after the last ROSC.   Caretaker reports to EMS pt's hx of heart transplant with recent rejection and stopping meds a few days ago. Transfusion yesterday, not specified. Pt c/o feeling very tired over the past 2 days.

## 2021-01-05 NOTE — Progress Notes (Addendum)
ANTICOAGULATION CONSULT NOTE - Initial Consult  Pharmacy Consult for heparin Indication: chest pain/ACS  Allergies  Allergen Reactions  . Compazine [Prochlorperazine Edisylate]     Patient Measurements: Height: 5\' 5"  (165.1 cm) Weight: 88.5 kg (195 lb) IBW/kg (Calculated) : 57 Heparin Dosing Weight: 76.4 kg   Vital Signs: Temp: 96.2 F (35.7 C) (05/04 1125) Temp Source: Temporal (05/04 1125) BP: 89/67 (05/04 1215) Pulse Rate: 59 (05/04 1215)  Labs: Recent Labs    Dec 10, 2020 1046 Dec 10, 2020 1047 Dec 10, 2020 1122 10-Dec-2020 1129  HGB 11.9* 12.2 9.5* 13.9  HCT 35.0* 36.0 35.2* 41.0  PLT  --   --  152  --   LABPROT  --   --  19.8*  --   INR  --   --  1.7*  --   CREATININE 1.20*  --  1.69*  --   TROPONINIHS  --   --  2,910*  --     Estimated Creatinine Clearance: 42.3 mL/min (A) (by C-G formula based on SCr of 1.69 mg/dL (H)).   Medical History: Past Medical History:  Diagnosis Date  . Cardiomegaly   . CHF (congestive heart failure) (HCC)   . Diabetes mellitus without complication (HCC)     Medications:  Scheduled:    Assessment: 38 yof with hx of heart transplant (subsequent severe allograft rejection) presenting after out-of-hospital cardiac arrest (down time of 40 minutes). EKG looking like inferior STEMI - discussed with heart transplant team, pt has had recent cath with widely patent coronaries, including RCA. Was intubated in ED and bedside ECHO showing depressed RV. CTA neg for PE.  Hgb 13.9, plt 152. Trop 2910. No s/sx of bleeding. INR 1.7.  Goal of Therapy:  Heparin level 0.3-0.7 units/ml Monitor platelets by anticoagulation protocol: Yes   Plan:  Give 4000 units bolus x 1 Start heparin infusion at 900 units/hr Check anti-Xa level in 6 hours and daily while on heparin Continue to monitor H&H and platelets  [id number], PharmD, BCCCP Clinical Pharmacist  Phone: [phone] 12-10-20 12:28 PM  Please check AMION for all [facility] Pharmacy phone numbers After  10:00 PM, call Main Pharmacy [phone]

## 2021-01-05 NOTE — Progress Notes (Signed)
  Echocardiogram 2D Echocardiogram has been performed.  Paige Santana 12-22-20, 12:52 PM

## 2021-01-05 NOTE — Progress Notes (Signed)
Supported family at bedside at time of death. Facilitated information sharing between family and staff.  Venida Jarvis, [place], [facility], Pager [phone]

## 2021-01-05 NOTE — Procedures (Signed)
Central Venous Catheter Insertion Procedure Note Nahomy Limburg [id number] 01-24-1966  Procedure: Insertion of Central Venous Catheter Indications: Assessment of intravascular volume, Drug and/or fluid administration and Frequent blood sampling  Procedure Details Consent: Unable to obtain consent because of emergent medical necessity. Time Out: Verified patient identification, verified procedure, site/side was marked, verified correct patient position, special equipment/implants available, medications/allergies/relevent history reviewed, required imaging and test results available.  Performed  Maximum sterile technique was used including antiseptics, cap, gloves, gown, hand hygiene, mask and sheet. Skin prep: Chlorhexidine; local anesthetic administered A antimicrobial bonded/coated triple lumen catheter was placed in the right femoral vein due to emergent situation using the Seldinger technique. Ultrasound guidance used.Yes.   Catheter placed to 20 cm. Blood aspirated via all 3 ports and then flushed x 3. Line sutured x 2 and dressing applied.  Evaluation Blood flow good Complications: No apparent complications Patient did tolerate procedure well. Chest X-ray ordered to verify placement.  CXR: pending.  Brett Canales Abbigale Mcelhaney ACNP Acute Care Nurse Practitioner [facility] Pulmonary/Critical Care Please consult Amion 01-06-2021, 12:34 PM

## 2021-01-05 NOTE — Consult Note (Addendum)
NAME:  Paige Santana, MRN:  [id number], DOB:  Feb 08, 1966, LOS: 0 ADMISSION DATE:  01/03/2021, CONSULTATION DATE: 12/21/2020 REFERRING MD: Emergency department physician, CHIEF COMPLAINT: Post cardiac arrest  History of Present Illness:  55 year old female with extensive past medical history is well-documented below most significant for heart transplant February 2018 and is in the process of retraction.  She is recently discharged from [facility] and while at home today having blood work done she stood up passed out and was asystolic EMS arrived and there was no shockable rhythm.  Note the family did not start CPR at the time of the arrest.  She is transported urgently to [facility] required Levophed and epinephrine to maintain adequate mean arterial pressure.  She was not a candidate for cardiac catheterization and interventions at this time.  Goal will be to stabilize and transfer to [facility] where she receives her transplant treatment.  Note sure current hemodynamic situation is tenuous at best.  Pertinent  Medical History   Past Medical History:  Diagnosis Date  . Cardiomegaly   . CHF (congestive heart failure) (HCC)   . Diabetes mellitus without complication (HCC)      Significant Hospital Events: Including procedures, antibiotic start and stop dates in addition to other pertinent events   . 12/29/2020 cardiac arrest 40-minute downtime . 12/24/2020 right femoral arterial line . 12/24/2020 right femoral CVL  Interim History / Subjective:  55 year old heart transplant approximately 4 years ago in rejection presents with 40 minutes downtime without shockable rhythm.  Objective   Blood pressure (!) 89/67, pulse (!) 59, temperature (!) 96.2 F (35.7 C), temperature source Temporal, resp. rate (!) 30, height 5\' 5"  (1.651 m), weight 88.5 kg, SpO2 (!) 89 %.    Vent Mode: PRVC FiO2 (%):  [100 %] 100 % Set Rate:  [22 bmp-30 bmp] 30 bmp Vt Set:  [450  mL] 450 mL PEEP:  [10 cmH20] 10 cmH20 Plateau Pressure:  [24 cmH20] 24 cmH20   Intake/Output Summary (Last 24 hours) at 12/23/2020 1231 Last data filed at 01/04/2021 1110 Gross per 24 hour  Intake 1000 ml  Output --  Net 1000 ml   Filed Weights   12/31/2020 1215  Weight: 88.5 kg    Examination: General: Morbidly obese female who is not requiring sedation intubated on full mechanical ventilatory support HENT: Pupils are pinpoint nonreactive, endotracheal tube gastric tube in place Lungs: Decreased breath sounds throughout Cardiovascular: Heart sounds are irregular Abdomen: Obese soft nontender Extremities: No noted edema Neuro: Does not respond to painful stimuli at this time.   Labs/imaging that I havepersonally reviewed  (right click and "Reselect all SmartList Selections" daily)  Arterial blood gases Basic metabolic package Hemoglobin hematocrit Bedside 2D echo  Resolved Hospital Problem list     Assessment & Plan:  Ventilator dependent respiratory failure in the setting of cardiac arrest witnessed with 40 minutes downtime. Ventilator bundle Monitor chest x-ray Increased respiratory rate to offset metabolic acidosis  Status post cardiac arrest 12/27/2020 witnessed with a nonshockable rhythm upon EMS arrival proximately 40-minute downtime with CPR.  She is status post orthotopic heart transplant February 2018 with subsequent rejection..  Stood up had a syncopal episode was found to be in a nonshockable rhythm acute cardiogenic shock transferred to [facility] for further evaluation and treatment. There was consideration for pulmonary embolism due to her prolonged downtime we will simply give her anticoagulation and not thrombolytics at this time. Vasopressor support utilizing epinephrine  and Levophed and vasopressin .  Cardiology input greatly appreciated Stat 2D echo Wean pressors as able If able to stabilize transferred to [facility] since she is a  heart transplant patient. Systemic anticoagulation CT pe protocol 12/06/2020 fall  Severe metabolic acidosis Prolonged downtime Increased respiratory rate to offset metabolic acidosis Bicarbonate drip Serial ABGs daily HMV   Diabetes mellitus type 2 CBG (last 3)  No results for input(s): GLUCAP in the last 72 hours.  Sliding-scale insulin protocol  Concern for multiorgan dysfunction Serial labs to monitor organ function.    Best practice (right click and "Reselect all SmartList Selections" daily)  Diet:  NPO Pain/Anxiety/Delirium protocol (if indicated): No VAP protocol (if indicated): Yes DVT prophylaxis: Systemic AC GI prophylaxis: PPI Glucose control:  SSI No Central venous access:  Yes, and it is still needed Arterial line:  Yes, and it is still needed Foley:  Yes, and it is still needed Mobility:  bed rest  PT consulted: N/A Last date of multidisciplinary goals of care discussion [tbd] Code Status:  full code Disposition: ER till stable then to [facility]  Labs   CBC: Recent Labs  Lab 12/20/2020 1046 12/06/2020 1047 01/03/2021 1122 12/10/2020 1129  WBC  --   --  12.4*  --   NEUTROABS  --   --  8.0*  --   HGB 11.9* 12.2 9.5* 13.9  HCT 35.0* 36.0 35.2* 41.0  MCV  --   --  111.4*  --   PLT  --   --  152  --     Basic Metabolic Panel: Recent Labs  Lab 01/04/2021 1046 12/19/2020 1047 12/27/2020 1122 12/10/2020 1129  NA 137 137 136 132*  K 4.1 4.3 3.1* 4.6  CL 109  --  108  --   CO2  --   --  8*  --   GLUCOSE 373*  --  652*  --   BUN 35*  --  29*  --   CREATININE 1.20*  --  1.69*  --   CALCIUM  --   --  6.3*  --    GFR: Estimated Creatinine Clearance: 42.3 mL/min (A) (by C-G formula based on SCr of 1.69 mg/dL (H)). Recent Labs  Lab 12/16/2020 1122 12/06/2020 1124  WBC 12.4*  --   LATICACIDVEN  --  >11.0*    Liver Function Tests: Recent Labs  Lab 12/21/2020 1122  AST 135*  ALT 151*  ALKPHOS 51  BILITOT 0.4  PROT 3.6*  ALBUMIN 2.3*   No results for input(s):  LIPASE, AMYLASE in the last 168 hours. No results for input(s): AMMONIA in the last 168 hours.  ABG    Component Value Date/Time   PHART 6.927 (LL) 12/26/2020 1129   PCO2ART 43.6 12/31/2020 1129   PO2ART 72 (L) 12/05/2020 1129   HCO3 9.3 (L) 12/23/2020 1129   TCO2 11 (L) 12/21/2020 1129   ACIDBASEDEF 23.0 (H) 01/04/2021 1129   O2SAT 83.0 01/02/2021 1129     Coagulation Profile: Recent Labs  Lab 12/21/2020 1122  INR 1.7*    Cardiac Enzymes: No results for input(s): CKTOTAL, CKMB, CKMBINDEX, TROPONINI in the last 168 hours.  HbA1C: No results found for: HGBA1C  CBG: No results for input(s): GLUCAP in the last 168 hours.  Review of Systems:   na  Past Medical History:  She,  has a past medical history of Cardiomegaly, CHF (congestive heart failure) (HCC), and Diabetes mellitus without complication (HCC).   Surgical History:  Past Surgical History:  Procedure Laterality Date  . CARDIAC DEFIBRILLATOR PLACEMENT       Social History:   reports that she has never smoked. She does not have any smokeless tobacco history on file. She reports that she does not drink alcohol and does not use drugs.   Family History:  Her family history is not on file.   Allergies Allergies  Allergen Reactions  . Compazine [Prochlorperazine Edisylate]      Home Medications  Prior to Admission medications   Medication Sig Start Date End Date Taking? Authorizing Provider  carvedilol (COREG) 25 MG tablet Take 25 mg by mouth 2 (two) times daily with a meal.    [provider]  dapsone 100 MG tablet Take 100 mg by mouth daily. 11/17/20   [provider]  Digoxin (DIGOX PO) Take by mouth.    [provider]  enalapril (VASOTEC) 20 MG tablet Take 20 mg by mouth daily.    [provider]  FARXIGA 10 MG TABS tablet Take 10 mg by mouth daily. 11/17/20   [provider]  furosemide (LASIX) 80 MG tablet Take 80 mg by mouth.    [provider]   insulin detemir (LEVEMIR) 100 UNIT/ML injection Inject into the skin at bedtime.    [provider]  lansoprazole (PREVACID) 30 MG capsule Take 30 mg by mouth daily. 12/03/20   [provider]  spironolactone (ALDACTONE) 25 MG tablet Take 25 mg by mouth daily.    [provider]     Critical care time: 52 min    Brett Canales Yer Olivencia ACNP Acute Care Nurse Practitioner [facility] Pulmonary/Critical Care Please consult Amion 12-24-20, 12:32 PM

## 2021-01-05 NOTE — Progress Notes (Signed)
Critical ABG results given to Brett Canales Minor NP at 1132 PH- 6.9 pco2-43.6 po2-72 HCO3-9.3  RR increased to 30

## 2021-01-05 NOTE — Progress Notes (Signed)
Critical ABG results given to Dr Denese Killings at 1241 PH-7.0 pCO2-38 pO2-76 HCO3-9.9

## 2021-01-05 NOTE — ED Provider Notes (Signed)
[facility] EMERGENCY DEPARTMENT Provider Note   CSN: [id number] Arrival date & time: 12/25/2020  1031  LEVEL 5 CAVEAT - UNRESPONSIVE History No chief complaint on file.   Paige Santana is a 55 y.o. female.  HPI 55 year old female brought in post CPR.  CPR started at around 9:45. Initially no shockable rhythm, then asystole.  Received 6 epinephrines and had around 40 minutes of CPR.  Brief return of spontaneous circulation that would come and go.  Did achieve return of spontaneous circulation right before arrival and has maintained a blood pressure since.  She recently had a cardiac transplant a few years ago and just went through a period of transplant rejection that was treated at [facility].  Today she had 2 seizure-like activity episodes and then did not wake up and the fire department found her to be in cardiac arrest.  Past Medical History:  Diagnosis Date  . Cardiomegaly   . CHF (congestive heart failure) (HCC)   . Diabetes mellitus without complication (HCC)     There are no problems to display for this patient.   Past Surgical History:  Procedure Laterality Date  . CARDIAC DEFIBRILLATOR PLACEMENT       OB History   No obstetric history on file.     No family history on file.  Social History   Tobacco Use  . Smoking status: Never Smoker  Substance Use Topics  . Alcohol use: No  . Drug use: No    Home Medications Prior to Admission medications   Medication Sig Start Date End Date Taking? Authorizing Provider  carvedilol (COREG) 25 MG tablet Take 25 mg by mouth 2 (two) times daily with a meal.    [provider]  dapsone 100 MG tablet Take 100 mg by mouth daily. 11/17/20   [provider]  Digoxin (DIGOX PO) Take by mouth.    [provider]  enalapril (VASOTEC) 20 MG tablet Take 20 mg by mouth daily.    [provider]  FARXIGA 10 MG TABS tablet Take 10 mg by mouth daily. 11/17/20   [provider]   furosemide (LASIX) 80 MG tablet Take 80 mg by mouth.    [provider]  insulin detemir (LEVEMIR) 100 UNIT/ML injection Inject into the skin at bedtime.    [provider]  lansoprazole (PREVACID) 30 MG capsule Take 30 mg by mouth daily. 12/03/20   [provider]  spironolactone (ALDACTONE) 25 MG tablet Take 25 mg by mouth daily.    [provider]    Allergies    Compazine [prochlorperazine edisylate]  Review of Systems   Review of Systems  Unable to perform ROS: Patient unresponsive    Physical Exam Updated Vital Signs BP 106/77   Pulse (!) 51   Temp (!) 96.2 F (35.7 C) (Temporal)   Resp (!) 30   Ht 5\' 5"  (1.651 m)   SpO2 100%   BMI 23.30 kg/m   Physical Exam Vitals and nursing note reviewed.  Constitutional:      Appearance: She is well-developed.  HENT:     Head: Normocephalic and atraumatic.     Right Ear: External ear normal.     Left Ear: External ear normal.     Nose: Nose normal.  Eyes:     General:        Right eye: No discharge.        Left eye: No discharge.     Comments: Pupils ~2 mm bilaterally,  minimally reactive  Cardiovascular:     Rate and Rhythm: Normal rate and regular rhythm.  Pulmonary:     Effort: Pulmonary effort is normal.     Breath sounds: Normal breath sounds.     Comments: Being bagged via king airway, is spontaneously breathing as well Abdominal:     General: There is no distension.  Skin:    General: Skin is warm and dry.     Comments: No mottling  Neurological:     Mental Status: She is unresponsive.     Comments: No spontaneous movement  Psychiatric:        Mood and Affect: Mood is not anxious.     ED Results / Procedures / Treatments   Labs (all labs ordered are listed, but only abnormal results are displayed) Labs Reviewed  COMPREHENSIVE METABOLIC PANEL - Abnormal; Notable for the following components:      Result Value   Potassium 3.1 (*)    CO2 8 (*)    Glucose, Bld 652 (*)     BUN 29 (*)    Creatinine, Ser 1.69 (*)    Calcium 6.3 (*)    Total Protein 3.6 (*)    Albumin 2.3 (*)    AST 135 (*)    ALT 151 (*)    GFR, Estimated 36 (*)    Anion gap 20 (*)    All other components within normal limits  LACTIC ACID, PLASMA - Abnormal; Notable for the following components:   Lactic Acid, Venous >11.0 (*)    All other components within normal limits  CBC WITH DIFFERENTIAL/PLATELET - Abnormal; Notable for the following components:   WBC 12.4 (*)    RBC 3.16 (*)    Hemoglobin 9.5 (*)    HCT 35.2 (*)    MCV 111.4 (*)    MCHC 27.0 (*)    RDW 16.5 (*)    nRBC 0.6 (*)    Neutro Abs 8.0 (*)    Abs Immature Granulocytes 2.21 (*)    All other components within normal limits  PROTIME-INR - Abnormal; Notable for the following components:   Prothrombin Time 19.8 (*)    INR 1.7 (*)    All other components within normal limits  I-STAT CHEM 8, ED - Abnormal; Notable for the following components:   BUN 35 (*)    Creatinine, Ser 1.20 (*)    Glucose, Bld 373 (*)    Calcium, Ion 0.93 (*)    TCO2 13 (*)    Hemoglobin 11.9 (*)    HCT 35.0 (*)    All other components within normal limits  I-STAT ARTERIAL BLOOD GAS, ED - Abnormal; Notable for the following components:   pH, Arterial 6.927 (*)    pO2, Arterial 72 (*)    Bicarbonate 9.3 (*)    TCO2 11 (*)    Acid-base deficit 23.0 (*)    Sodium 132 (*)    Calcium, Ion 1.11 (*)    All other components within normal limits  I-STAT BETA HCG BLOOD, ED (MC, WL, AP ONLY) - Abnormal; Notable for the following components:   I-stat hCG, quantitative 9.1 (*)    All other components within normal limits  I-STAT VENOUS BLOOD GAS, ED - Abnormal; Notable for the following components:   pH, Ven 7.036 (*)    pCO2, Ven 38.4 (*)    pO2, Ven 48.0 (*)    Bicarbonate 10.3 (*)    TCO2 11 (*)    Acid-base deficit 20.0 (*)  Calcium, Ion 0.92 (*)    All other components within normal limits  TROPONIN I (HIGH SENSITIVITY) - Abnormal;  Notable for the following components:   Troponin I (High Sensitivity) 2,910 (*)    All other components within normal limits  CULTURE, BLOOD (ROUTINE X 2)  CULTURE, BLOOD (ROUTINE X 2)  LACTIC ACID, PLASMA  URINALYSIS, ROUTINE W REFLEX MICROSCOPIC  HEPARIN LEVEL (UNFRACTIONATED)  CBG MONITORING, ED  I-STAT VENOUS BLOOD GAS, ED  TYPE AND SCREEN  TROPONIN I (HIGH SENSITIVITY)    EKG EKG Interpretation  Date/Time:  Wednesday Dec 08 2020 10:47:18 EDT Ventricular Rate:  94 PR Interval:  180 QRS Duration: 144 QT Interval:  385 QTC Calculation: 482 R Axis:   109 Text Interpretation: Sinus rhythm Multiform ventricular premature complexes RBBB and LPFB Inferior infarct, acute (RCA) Lateral leads are also involved Probable RV involvement, suggest recording right precordial leads >>> Acute MI <<< Confirmed by Pricilla LovelessGoldston, Chany Woolworth [phone]([id number]) on 12/29/2020 11:20:25 AM   Radiology DG Chest Portable 1 View  Result Date: 12/30/2020 CLINICAL DATA:  Post CPR.  History heart transplant EXAM: PORTABLE CHEST 1 VIEW COMPARISON:  None available FINDINGS: Overlying cardiac leads and pacer pads. Endotracheal tube terminates approximately 2.7 cm above the carina. Enteric tube courses below the diaphragm with distal tip extending beyond the inferior margin of the film. Right upper extremity PICC line terminates at the level of the superior cavoatrial junction. Prior median sternotomy. Heart size is upper limits of normal, which may be accentuated by AP portable supine technique. Diffuse hazy opacities throughout both lungs. No large pleural fluid collection. No pneumothorax. No acute osseous findings. IMPRESSION: 1. Lines and tubes as described. 2. Diffuse hazy opacities throughout both lungs, may reflect pulmonary edema, ARDS, versus multifocal infection. 3. Heart size upper limits of normal, which may be accentuated by AP portable supine technique. Electronically Signed   By: Duanne GuessNicholas  Plundo D.O.   On: 12/31/2020 11:29     Procedures Procedure Name: Intubation Date/Time: 12/31/2020 12:05 PM Performed by: Pricilla LovelessGoldston, Jamesia Linnen, MD Pre-anesthesia Checklist: Patient identified, Patient being monitored, Emergency Drugs available, Timeout performed and Suction available Oxygen Delivery Method: Non-rebreather mask Preoxygenation: Pre-oxygenation with 100% oxygen Induction Type: Rapid sequence Ventilation: Mask ventilation without difficulty Laryngoscope Size: Glidescope and 3 Grade View: Grade II Tube size: 7.5 mm Number of attempts: 1 Airway Equipment and Method: Video-laryngoscopy Placement Confirmation: ETT inserted through vocal cords under direct vision,  CO2 detector and Breath sounds checked- equal and bilateral Dental Injury: Teeth and Oropharynx as per pre-operative assessment     .Critical Care Performed by: Pricilla LovelessGoldston, Trystin Hargrove, MD Authorized by: Pricilla LovelessGoldston, Johnell Landowski, MD   Critical care provider statement:    Critical care time (minutes):  45   Critical care time was exclusive of:  Separately billable procedures and treating other patients   Critical care was necessary to treat or prevent imminent or life-threatening deterioration of the following conditions:  Cardiac failure, circulatory failure and respiratory failure   Critical care was time spent personally by me on the following activities:  Discussions with consultants, evaluation of patient's response to treatment, examination of patient, ordering and performing treatments and interventions, ordering and review of laboratory studies, ordering and review of radiographic studies, pulse oximetry, re-evaluation of patient's condition, obtaining history from patient or surrogate, review of old charts and ventilator management     Medications Ordered in ED Medications  fentaNYL (SUBLIMAZE) injection 100 mcg (has no administration in time range)  fentaNYL (SUBLIMAZE) injection 100 mcg (has no  administration in time range)  sodium bicarbonate 150 mEq in  sterile water 1,150 mL infusion ( Intravenous New Bag/Given 12/16/2020 1122)  EPINEPHrine (ADRENALIN) 4 mg in NS 250 mL (0.016 mg/mL) premix infusion (2 mcg/min Intravenous New Bag/Given Dec 16, 2020 1103)  norepinephrine (LEVOPHED) 4mg  in premix infusion (1.3333 mcg/min Intravenous New Bag/Given 2020-12-16 1036)  vasopressin (PITRESSIN) 20 Units in sodium chloride 0.9 % 100 mL infusion-*FOR SHOCK* (has no administration in time range)  sodium bicarbonate injection (50 mEq Intravenous Given 2020/12/16 1148)  EPINEPhrine 10 mcg/mL Adult IV Push Syringe (For Blood Pressure Support) (5 mcg Intravenous Given 12-16-2020 1150)  etomidate (AMIDATE) injection (20 mg Intravenous Given 12/16/20 1041)  rocuronium (ZEMURON) injection (70 mg Intravenous Given 12/16/20 1042)  sodium chloride 0.9 % bolus 1,000 mL (1,000 mLs Intravenous New Bag/Given 12/16/20 1045)    ED Course  I have reviewed the triage vital signs and the nursing notes.  Pertinent labs & imaging results that were available during my care of the patient were reviewed by me and considered in my medical decision making (see chart for details).  Clinical Course as of 12-16-2020 1208  Wed 12-16-20  1057 I discussed presentation and ECG with Dr. Dec 10, 2020. While it clearly has ischemia, given prolonged down time they will not rush her to cath lab but will send team down. [SG]  1057 D/w ICU, they will admit [SG]    Clinical Course User Index [SG] Excell Seltzer, MD   MDM Rules/Calculators/A&P                          Patient presents status postcardiac arrest.  She was placed on Levophed shortly after arrival.  Since then and since being intubated, she has had progressively worsening hypotension requiring increasing pressor requirements.  We have also placed her on epinephrine which has seemed to help.  Otherwise she is quite acidotic with a lactate over 11 and her pH is reflecting a metabolic acidosis.  We will also place on bicarb drip.  I discussed with family  in the consultation room how critically ill she is.  She has significant ischemia/STEMI on her ECG but as per above conversation, cardiology is going to hold off on any type of intervention given prolonged downtime and unstable vitals.  Will admit to the ICU in critical condition. Final Clinical Impression(s) / ED Diagnoses Final diagnoses:  Cardiac arrest (HCC)  Acute respiratory failure with hypoxia [facility])    Rx / DC Orders ED Discharge Orders    None       [facility], MD Dec 16, 2020 1237

## 2021-01-05 DEATH — deceased

## 2022-03-28 IMAGING — CT CT HEAD W/O CM
4 series · 16 of 47 positions shown, 18 images · non-contrast
Comparison: None.

CLINICAL DATA: Altered mental status

EXAM:
CT HEAD WITHOUT CONTRAST
TECHNIQUE: Contiguous axial images were obtained from the base of the skull
through the vertex without intravenous contrast.

[Series 1: head bone · axial · 0.46mm/px · z∈[-191,-155]mm · 3 of 88 slices shown]
[im 9/88  bone]
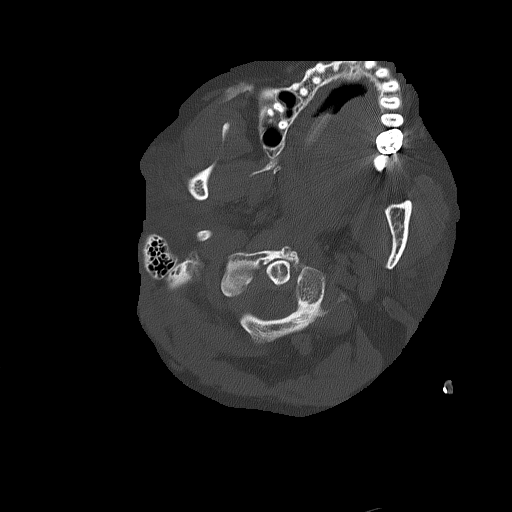
[im 18/88  bone]
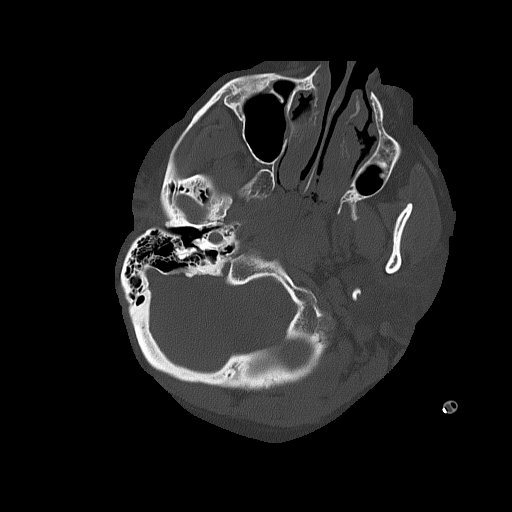
[im 27/88  bone]
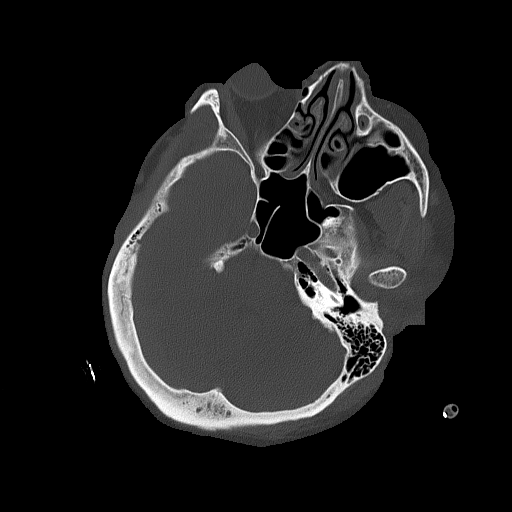

[Series 4: head without cor · coronal · non-contrast · 0.36mm/px · 3 of 77 slices shown]
[im 26/77  brain]
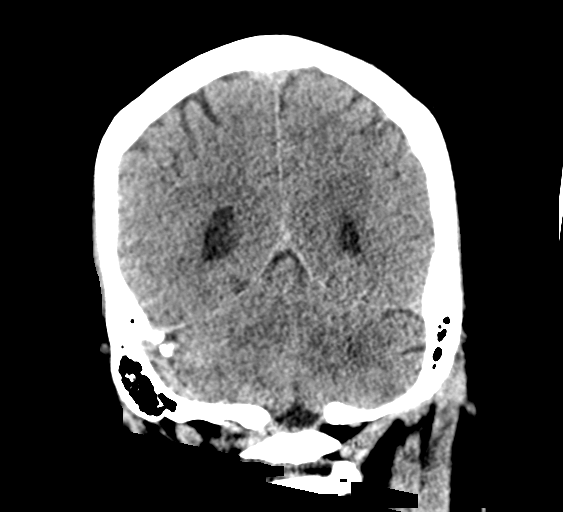
[im 34/77  brain]
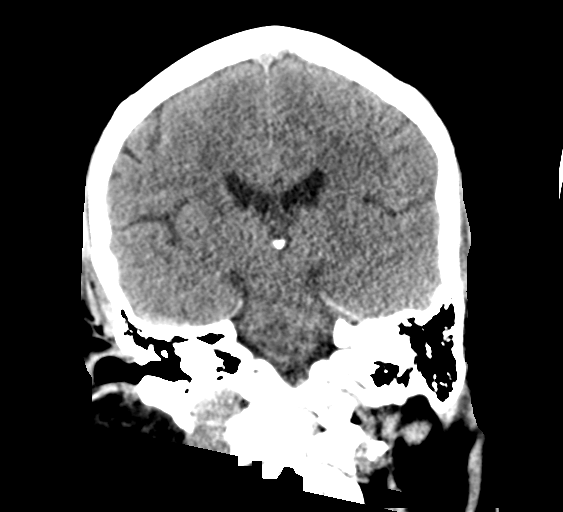
[im 43/77  brain]
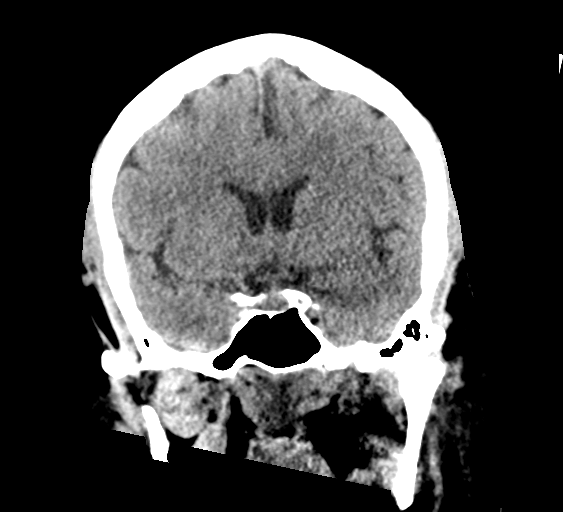

[Series 5: head without sag · sagittal · non-contrast · 0.36mm/px · 3 of 67 slices shown]
[im 24/67  brain]
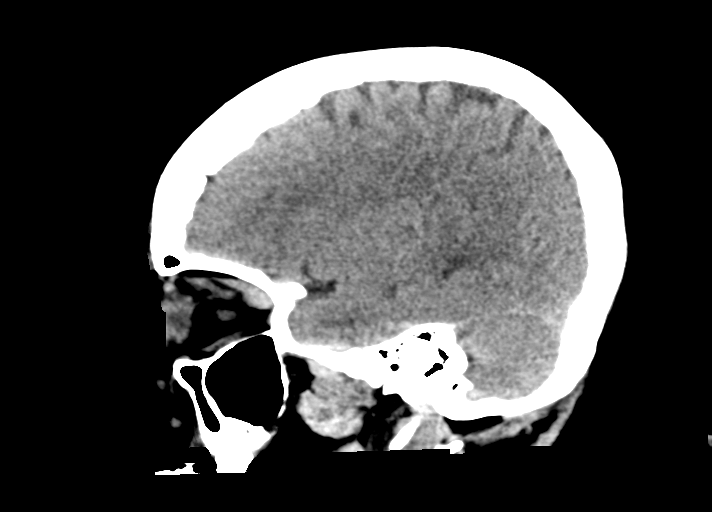
[im 34/67  brain]
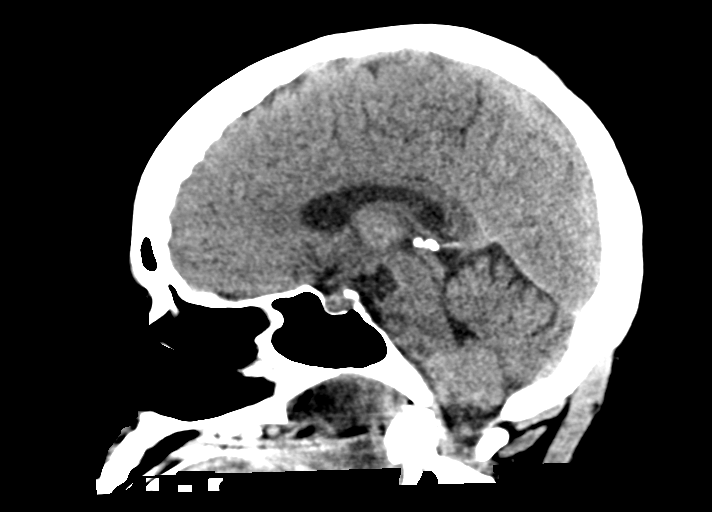
[im 44/67  brain]
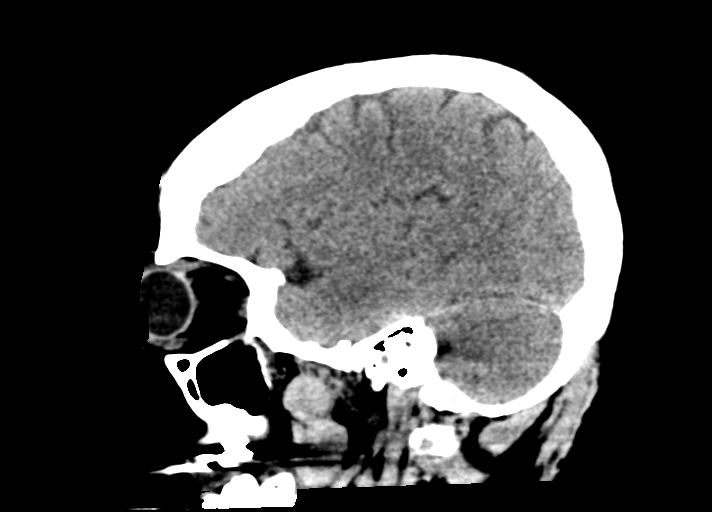

[Series 7: head without · axial · non-contrast · 0.46mm/px · z∈[-187,-62]mm · 7 of 35 slices shown, 9 images]
[im 5/35  brain]
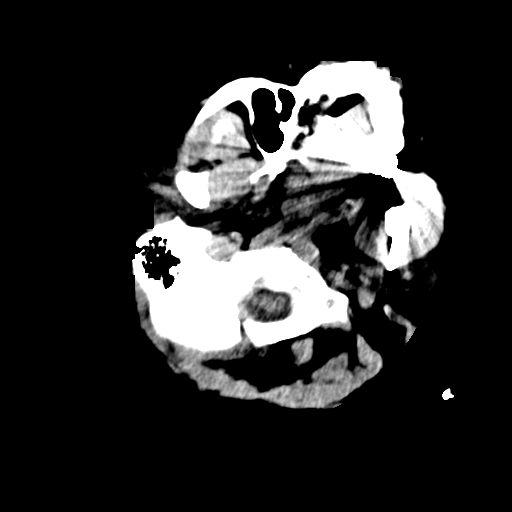
[im 5/35  bone]
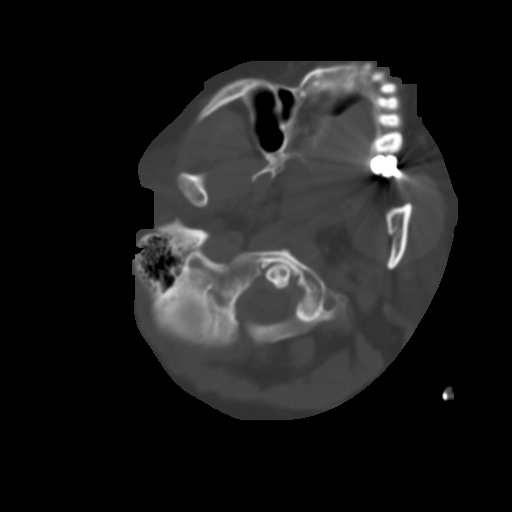
[im 9/35  brain]
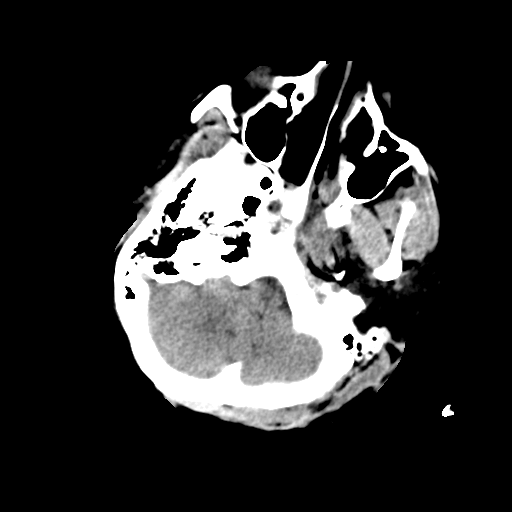
[im 13/35  brain]
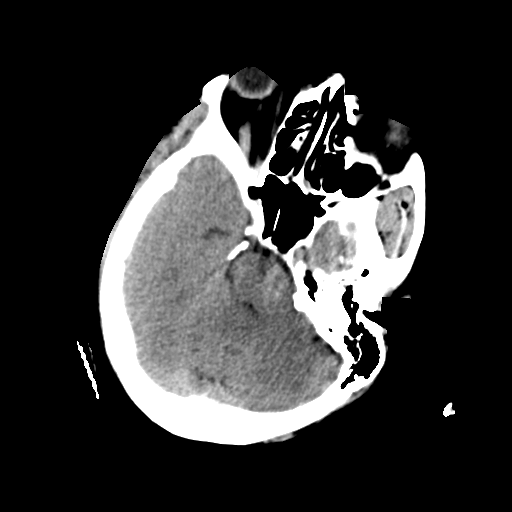
[im 18/35  brain]
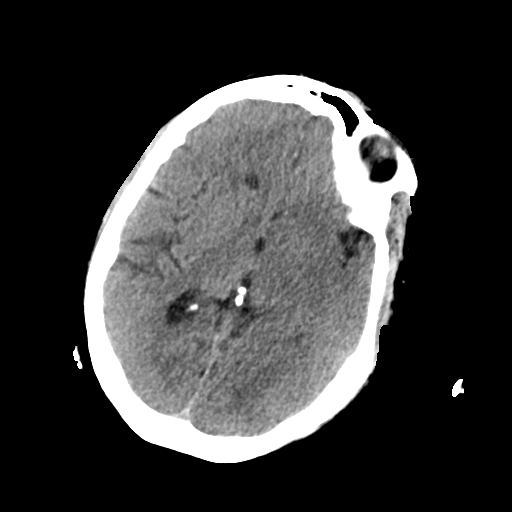
[im 22/35  brain]
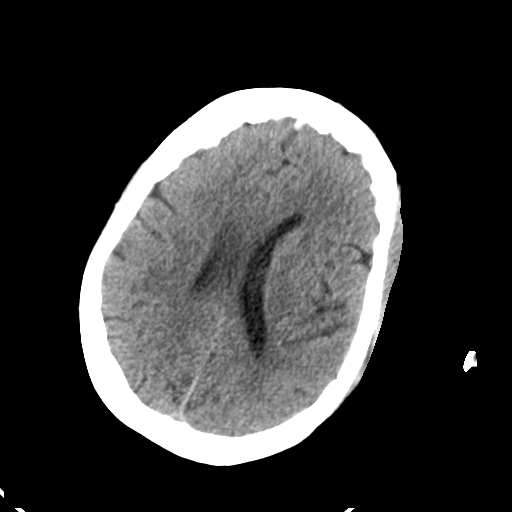
[im 22/35  bone]
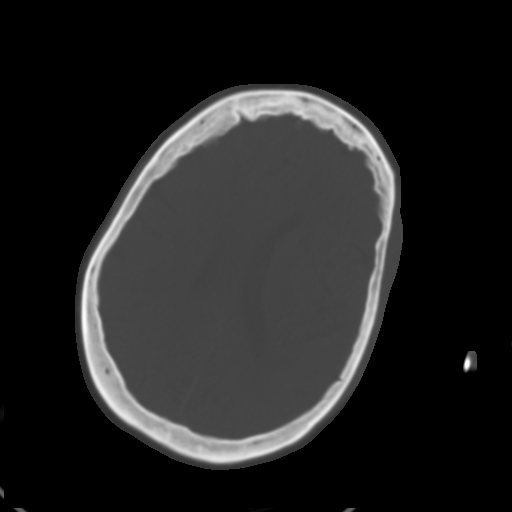
[im 26/35  brain]
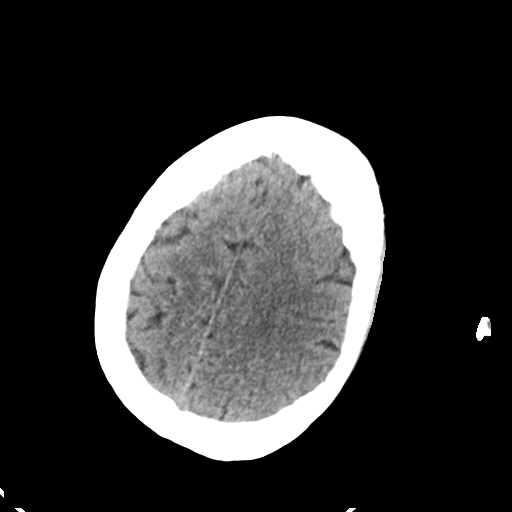
[im 30/35  brain]
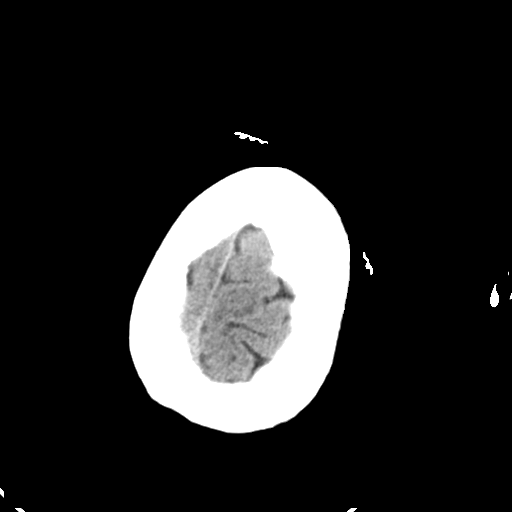

[16 of 47 positions shown; findings below may reference images not displayed]

FINDINGS: Brain: No evidence of acute infarction, hemorrhage, hydrocephalus,
extra-axial collection or mass lesion/mass effect.

Vascular: No hyperdense vessel or unexpected calcification.

Skull: Normal. Negative for fracture or focal lesion.

Sinuses/Orbits: No acute finding.

Other: None.
IMPRESSION: No acute intracranial abnormality noted.
